# Patient Record
Sex: Female | Born: 1970
Health system: Southern US, Community
[De-identification: ages and names within clinical notes are randomized; demographics above are authoritative.]

## PROBLEM LIST (undated history)

## (undated) DIAGNOSIS — Z789 Other specified health status: Secondary | ICD-10-CM

---

## 2015-04-27 ENCOUNTER — Other Ambulatory Visit (HOSPITAL_COMMUNITY): Payer: Self-pay | Admitting: Internal Medicine

## 2015-04-27 DIAGNOSIS — Z1231 Encounter for screening mammogram for malignant neoplasm of breast: Secondary | ICD-10-CM

## 2015-05-11 ENCOUNTER — Ambulatory Visit (HOSPITAL_COMMUNITY)
Admission: RE | Admit: 2015-05-11 | Discharge: 2015-05-11 | Disposition: A | Discharge status: Home / Self Care | Payer: BLUE CROSS/BLUE SHIELD | Source: Ambulatory Visit | Attending: Internal Medicine | Admitting: Internal Medicine

## 2015-05-11 DIAGNOSIS — Z1231 Encounter for screening mammogram for malignant neoplasm of breast: Secondary | ICD-10-CM | POA: Diagnosis present

## 2015-11-17 DIAGNOSIS — Z0000 Encounter for general adult medical examination without abnormal findings: Secondary | ICD-10-CM | POA: Diagnosis not present

## 2015-11-21 DIAGNOSIS — Z0000 Encounter for general adult medical examination without abnormal findings: Secondary | ICD-10-CM | POA: Diagnosis not present

## 2016-04-08 ENCOUNTER — Other Ambulatory Visit (HOSPITAL_COMMUNITY): Payer: Self-pay | Admitting: Internal Medicine

## 2016-04-08 DIAGNOSIS — Z1231 Encounter for screening mammogram for malignant neoplasm of breast: Secondary | ICD-10-CM

## 2016-05-13 ENCOUNTER — Ambulatory Visit (HOSPITAL_COMMUNITY): Payer: BLUE CROSS/BLUE SHIELD

## 2016-05-15 ENCOUNTER — Ambulatory Visit (HOSPITAL_COMMUNITY)
Admission: RE | Admit: 2016-05-15 | Discharge: 2016-05-15 | Disposition: A | Discharge status: Home / Self Care | Payer: BLUE CROSS/BLUE SHIELD | Source: Ambulatory Visit | Attending: Internal Medicine | Admitting: Internal Medicine

## 2016-05-15 DIAGNOSIS — Z1231 Encounter for screening mammogram for malignant neoplasm of breast: Secondary | ICD-10-CM | POA: Diagnosis not present

## 2016-05-16 ENCOUNTER — Other Ambulatory Visit: Payer: Self-pay | Admitting: Internal Medicine

## 2016-05-16 DIAGNOSIS — R928 Other abnormal and inconclusive findings on diagnostic imaging of breast: Secondary | ICD-10-CM

## 2016-05-27 DIAGNOSIS — Z23 Encounter for immunization: Secondary | ICD-10-CM | POA: Diagnosis not present

## 2016-06-04 ENCOUNTER — Ambulatory Visit (HOSPITAL_COMMUNITY)
Admission: RE | Admit: 2016-06-04 | Discharge: 2016-06-04 | Disposition: A | Discharge status: Home / Self Care | Payer: BLUE CROSS/BLUE SHIELD | Source: Ambulatory Visit | Attending: Internal Medicine | Admitting: Internal Medicine

## 2016-06-04 DIAGNOSIS — R928 Other abnormal and inconclusive findings on diagnostic imaging of breast: Secondary | ICD-10-CM | POA: Diagnosis not present

## 2016-07-31 DIAGNOSIS — Z01419 Encounter for gynecological examination (general) (routine) without abnormal findings: Secondary | ICD-10-CM | POA: Diagnosis not present

## 2016-07-31 DIAGNOSIS — Z6832 Body mass index (BMI) 32.0-32.9, adult: Secondary | ICD-10-CM | POA: Diagnosis not present

## 2017-04-24 ENCOUNTER — Other Ambulatory Visit (HOSPITAL_COMMUNITY): Payer: Self-pay | Admitting: Internal Medicine

## 2017-04-24 DIAGNOSIS — Z1231 Encounter for screening mammogram for malignant neoplasm of breast: Secondary | ICD-10-CM

## 2017-05-26 ENCOUNTER — Ambulatory Visit (HOSPITAL_COMMUNITY)
Admission: RE | Admit: 2017-05-26 | Discharge: 2017-05-26 | Disposition: A | Discharge status: Home / Self Care | Payer: BLUE CROSS/BLUE SHIELD | Source: Ambulatory Visit | Attending: Internal Medicine | Admitting: Internal Medicine

## 2017-05-26 DIAGNOSIS — Z1231 Encounter for screening mammogram for malignant neoplasm of breast: Secondary | ICD-10-CM | POA: Insufficient documentation

## 2017-06-10 DIAGNOSIS — Z6831 Body mass index (BMI) 31.0-31.9, adult: Secondary | ICD-10-CM | POA: Diagnosis not present

## 2017-06-10 DIAGNOSIS — Z23 Encounter for immunization: Secondary | ICD-10-CM | POA: Diagnosis not present

## 2017-06-10 DIAGNOSIS — B079 Viral wart, unspecified: Secondary | ICD-10-CM | POA: Diagnosis not present

## 2017-06-16 DIAGNOSIS — L280 Lichen simplex chronicus: Secondary | ICD-10-CM | POA: Diagnosis not present

## 2017-06-16 DIAGNOSIS — Z1283 Encounter for screening for malignant neoplasm of skin: Secondary | ICD-10-CM | POA: Diagnosis not present

## 2017-06-16 DIAGNOSIS — L918 Other hypertrophic disorders of the skin: Secondary | ICD-10-CM | POA: Diagnosis not present

## 2017-07-09 DIAGNOSIS — L918 Other hypertrophic disorders of the skin: Secondary | ICD-10-CM | POA: Diagnosis not present

## 2017-09-03 DIAGNOSIS — Z6833 Body mass index (BMI) 33.0-33.9, adult: Secondary | ICD-10-CM | POA: Diagnosis not present

## 2017-09-03 DIAGNOSIS — Z01419 Encounter for gynecological examination (general) (routine) without abnormal findings: Secondary | ICD-10-CM | POA: Diagnosis not present

## 2017-12-02 DIAGNOSIS — Z6832 Body mass index (BMI) 32.0-32.9, adult: Secondary | ICD-10-CM | POA: Diagnosis not present

## 2017-12-02 DIAGNOSIS — B079 Viral wart, unspecified: Secondary | ICD-10-CM | POA: Diagnosis not present

## 2017-12-02 DIAGNOSIS — Z0000 Encounter for general adult medical examination without abnormal findings: Secondary | ICD-10-CM | POA: Diagnosis not present

## 2018-04-22 ENCOUNTER — Other Ambulatory Visit (HOSPITAL_COMMUNITY): Payer: Self-pay | Admitting: Physician Assistant

## 2018-04-22 DIAGNOSIS — Z1231 Encounter for screening mammogram for malignant neoplasm of breast: Secondary | ICD-10-CM

## 2018-05-28 ENCOUNTER — Encounter (HOSPITAL_COMMUNITY): Payer: Self-pay

## 2018-05-28 ENCOUNTER — Ambulatory Visit (HOSPITAL_COMMUNITY)
Admission: RE | Admit: 2018-05-28 | Discharge: 2018-05-28 | Disposition: A | Discharge status: Home / Self Care | Payer: BLUE CROSS/BLUE SHIELD | Source: Ambulatory Visit | Attending: Physician Assistant | Admitting: Physician Assistant

## 2018-05-28 DIAGNOSIS — Z1231 Encounter for screening mammogram for malignant neoplasm of breast: Secondary | ICD-10-CM | POA: Diagnosis not present

## 2018-06-01 ENCOUNTER — Other Ambulatory Visit (HOSPITAL_COMMUNITY): Payer: Self-pay | Admitting: Physician Assistant

## 2018-06-01 DIAGNOSIS — R928 Other abnormal and inconclusive findings on diagnostic imaging of breast: Secondary | ICD-10-CM

## 2018-06-23 ENCOUNTER — Ambulatory Visit (HOSPITAL_COMMUNITY)
Admission: RE | Admit: 2018-06-23 | Discharge: 2018-06-23 | Disposition: A | Discharge status: Home / Self Care | Payer: BLUE CROSS/BLUE SHIELD | Source: Ambulatory Visit | Attending: Physician Assistant | Admitting: Physician Assistant

## 2018-06-23 ENCOUNTER — Ambulatory Visit (HOSPITAL_COMMUNITY): Payer: BLUE CROSS/BLUE SHIELD

## 2018-06-23 DIAGNOSIS — R928 Other abnormal and inconclusive findings on diagnostic imaging of breast: Secondary | ICD-10-CM | POA: Insufficient documentation

## 2018-08-10 DIAGNOSIS — Z23 Encounter for immunization: Secondary | ICD-10-CM | POA: Diagnosis not present

## 2018-09-08 DIAGNOSIS — Z803 Family history of malignant neoplasm of breast: Secondary | ICD-10-CM | POA: Diagnosis not present

## 2018-09-08 DIAGNOSIS — Z6834 Body mass index (BMI) 34.0-34.9, adult: Secondary | ICD-10-CM | POA: Diagnosis not present

## 2018-09-08 DIAGNOSIS — Z01419 Encounter for gynecological examination (general) (routine) without abnormal findings: Secondary | ICD-10-CM | POA: Diagnosis not present

## 2018-12-15 DIAGNOSIS — Z0000 Encounter for general adult medical examination without abnormal findings: Secondary | ICD-10-CM | POA: Diagnosis not present

## 2018-12-15 DIAGNOSIS — Z6834 Body mass index (BMI) 34.0-34.9, adult: Secondary | ICD-10-CM | POA: Diagnosis not present

## 2018-12-15 DIAGNOSIS — E782 Mixed hyperlipidemia: Secondary | ICD-10-CM | POA: Diagnosis not present

## 2018-12-15 DIAGNOSIS — B079 Viral wart, unspecified: Secondary | ICD-10-CM | POA: Diagnosis not present

## 2019-05-06 DIAGNOSIS — B079 Viral wart, unspecified: Secondary | ICD-10-CM | POA: Diagnosis not present

## 2019-05-06 DIAGNOSIS — Z23 Encounter for immunization: Secondary | ICD-10-CM | POA: Diagnosis not present

## 2019-05-12 ENCOUNTER — Other Ambulatory Visit (HOSPITAL_COMMUNITY): Payer: Self-pay | Admitting: Physician Assistant

## 2019-05-12 DIAGNOSIS — Z1231 Encounter for screening mammogram for malignant neoplasm of breast: Secondary | ICD-10-CM

## 2019-05-31 DIAGNOSIS — B079 Viral wart, unspecified: Secondary | ICD-10-CM | POA: Diagnosis not present

## 2019-06-10 ENCOUNTER — Ambulatory Visit (HOSPITAL_COMMUNITY)
Admission: RE | Admit: 2019-06-10 | Discharge: 2019-06-10 | Disposition: A | Discharge status: Home / Self Care | Payer: BC Managed Care – PPO | Source: Ambulatory Visit | Attending: Physician Assistant | Admitting: Physician Assistant

## 2019-06-10 ENCOUNTER — Encounter (HOSPITAL_COMMUNITY): Payer: Self-pay

## 2019-06-10 ENCOUNTER — Other Ambulatory Visit: Payer: Self-pay

## 2019-06-10 DIAGNOSIS — Z1231 Encounter for screening mammogram for malignant neoplasm of breast: Secondary | ICD-10-CM | POA: Diagnosis not present

## 2019-06-14 DIAGNOSIS — B079 Viral wart, unspecified: Secondary | ICD-10-CM | POA: Diagnosis not present

## 2019-06-14 DIAGNOSIS — Z1389 Encounter for screening for other disorder: Secondary | ICD-10-CM | POA: Diagnosis not present

## 2019-06-14 DIAGNOSIS — Z1331 Encounter for screening for depression: Secondary | ICD-10-CM | POA: Diagnosis not present

## 2019-11-25 ENCOUNTER — Ambulatory Visit: Payer: Self-pay | Attending: Internal Medicine

## 2019-11-25 DIAGNOSIS — Z23 Encounter for immunization: Secondary | ICD-10-CM

## 2019-11-25 NOTE — Progress Notes (Signed)
   Covid-19 Vaccination Clinic  Name:  Cheryl Steele    MRN: [id number] DOB: 23-Dec-1970  11/25/2019  [name] was observed post Covid-19 immunization for 15 minutes without incident. She was provided with Vaccine Information Sheet and instruction to access the V-Safe system.   [name] was instructed to call 911 with any severe reactions post vaccine: Marland Kitchen Difficulty breathing  . Swelling of face and throat  . A fast heartbeat  . A bad rash all over body  . Dizziness and weakness   Immunizations Administered    Name Date Dose VIS Date Route   Moderna COVID-19 Vaccine 11/25/2019  9:30 AM 0.5 mL 06/2019 Intramuscular   Manufacturer: Moderna   Lot: [id number]   NDC: 43601-658-00

## 2019-12-23 ENCOUNTER — Ambulatory Visit: Payer: Self-pay | Attending: Internal Medicine

## 2019-12-23 DIAGNOSIS — Z23 Encounter for immunization: Secondary | ICD-10-CM

## 2019-12-23 NOTE — Progress Notes (Signed)
   Covid-19 Vaccination Clinic  Name:  Ninfa Giannelli    MRN: [id number] DOB: [birth year]  12/23/2019  Ms. Donson was observed post Covid-19 immunization for 15 minutes without incident. She was provided with Vaccine Information Sheet and instruction to access the V-Safe system.   Ms. Pair was instructed to call 911 with any severe reactions post vaccine: Marland Kitchen Difficulty breathing  . Swelling of face and throat  . A fast heartbeat  . A bad rash all over body  . Dizziness and weakness   Immunizations Administered    Name Date Dose VIS Date Route   Moderna COVID-19 Vaccine 12/23/2019 10:32 AM 0.5 mL 06/2019 Intramuscular   Manufacturer: Moderna   Lot: [id number]   NDC: 03014-996-92

## 2020-04-05 ENCOUNTER — Other Ambulatory Visit (HOSPITAL_BASED_OUTPATIENT_CLINIC_OR_DEPARTMENT_OTHER): Payer: Self-pay

## 2020-04-05 DIAGNOSIS — R0683 Snoring: Secondary | ICD-10-CM

## 2020-04-21 ENCOUNTER — Other Ambulatory Visit: Payer: Self-pay

## 2020-04-21 ENCOUNTER — Ambulatory Visit: Payer: No Typology Code available for payment source | Attending: Physician Assistant | Admitting: Neurology

## 2020-04-21 DIAGNOSIS — G4733 Obstructive sleep apnea (adult) (pediatric): Secondary | ICD-10-CM | POA: Insufficient documentation

## 2020-04-21 DIAGNOSIS — R0683 Snoring: Secondary | ICD-10-CM | POA: Insufficient documentation

## 2020-04-30 NOTE — Procedures (Addendum)
   [facility] Keamber Macfadden A. Gerilyn Pilgrim, MD     [email or web address]             NOCTURNAL POLYSOMNOGRAPHY   LOCATION: [facility]  Patient Name: Cheryl Steele, Cheryl Steele Date: 04/21/2020 Gender: Female D.O.B: 1971/05/27 Age (years): 30 Referring Provider: Arlyss Repress Allwardt Height (inches): 62 Interpreting Physician: Beryle Beams MD, ABSM Weight (lbs): 196 RPSGT: Peak, Robert BMI: 36 MRN: [id number] Neck Size: 13.50 CLINICAL INFORMATION Sleep Study Type: NPSG     Indication for sleep study: N/A     Epworth Sleepiness Score: 10     SLEEP STUDY TECHNIQUE As per the AASM Manual for the Scoring of Sleep and Associated Events v2.3 (April 2016) with a hypopnea requiring 4% desaturations.  The channels recorded and monitored were frontal, central and occipital EEG, electrooculogram (EOG), submentalis EMG (chin), nasal and oral airflow, thoracic and abdominal wall motion, anterior tibialis EMG, snore microphone, electrocardiogram, and pulse oximetry.  MEDICATIONS Medications self-administered by patient taken the night of the study : N/A No current outpatient medications on file.      SLEEP ARCHITECTURE The study was initiated at 9:20:10 PM and ended at 4:30:55 AM.  Sleep onset time was 49.9 minutes and the sleep efficiency was 81.9%%. The total sleep time was 352.8 minutes.  Stage REM latency was 124.0 minutes.  The patient spent 4.3%% of the night in stage N1 sleep, 80.6%% in stage N2 sleep, 3.4%% in stage N3 and 11.8% in REM.  Alpha intrusion was absent.  Supine sleep was 11.71%.  RESPIRATORY PARAMETERS The overall apnea/hypopnea index (AHI) was 15.5 per hour. There were 2 total apneas, including 2 obstructive, 0 central and 0 mixed apneas. There were 89 hypopneas and 0 RERAs.  The AHI during Stage REM sleep was 44.8 per hour.  AHI while supine was 43.6 per hour.  The mean oxygen saturation was 93.9%. The minimum SpO2 during sleep was 83.0%.  loud  snoring was noted during this study.  CARDIAC DATA The 2 lead EKG demonstrated sinus rhythm. The mean heart rate was 80.7 beats per minute. Other EKG findings include: None.  LEG MOVEMENT DATA The total PLMS were 0 with a resulting PLMS index of 0.0. Associated arousal with leg movement index was 0.0.  IMPRESSIONS Moderate obstructive sleep apnea worse during REM sleep is noted. AutoPAP 8-14 is recommended.    Argie Ramming, MD Diplomate, American Board of Sleep Medicine.  ELECTRONICALLY SIGNED ON:  04/30/2020, 9:49 PM [facility] PH: [phone]   FX: [phone] ACCREDITED BY THE AMERICAN ACADEMY OF SLEEP MEDICINE

## 2020-05-04 ENCOUNTER — Other Ambulatory Visit (HOSPITAL_COMMUNITY): Payer: Self-pay | Admitting: Physician Assistant

## 2020-05-04 DIAGNOSIS — Z1231 Encounter for screening mammogram for malignant neoplasm of breast: Secondary | ICD-10-CM

## 2020-06-12 ENCOUNTER — Ambulatory Visit (HOSPITAL_COMMUNITY)
Admission: RE | Admit: 2020-06-12 | Discharge: 2020-06-12 | Disposition: A | Discharge status: Home / Self Care | Payer: No Typology Code available for payment source | Source: Ambulatory Visit | Attending: Physician Assistant | Admitting: Physician Assistant

## 2020-06-12 ENCOUNTER — Other Ambulatory Visit: Payer: Self-pay

## 2020-06-12 DIAGNOSIS — Z1231 Encounter for screening mammogram for malignant neoplasm of breast: Secondary | ICD-10-CM | POA: Diagnosis present

## 2021-03-28 ENCOUNTER — Other Ambulatory Visit: Payer: Self-pay | Admitting: Family Medicine

## 2021-03-28 DIAGNOSIS — Z1211 Encounter for screening for malignant neoplasm of colon: Secondary | ICD-10-CM

## 2021-04-06 ENCOUNTER — Encounter: Payer: Self-pay | Admitting: Obstetrics & Gynecology

## 2021-04-06 ENCOUNTER — Ambulatory Visit (INDEPENDENT_AMBULATORY_CARE_PROVIDER_SITE_OTHER): Payer: No Typology Code available for payment source | Admitting: Obstetrics & Gynecology

## 2021-04-06 ENCOUNTER — Other Ambulatory Visit (HOSPITAL_COMMUNITY)
Admission: RE | Admit: 2021-04-06 | Discharge: 2021-04-06 | Disposition: A | Discharge status: Home / Self Care | Payer: No Typology Code available for payment source | Source: Ambulatory Visit | Attending: Obstetrics & Gynecology | Admitting: Obstetrics & Gynecology

## 2021-04-06 ENCOUNTER — Other Ambulatory Visit: Payer: Self-pay

## 2021-04-06 DIAGNOSIS — Z124 Encounter for screening for malignant neoplasm of cervix: Secondary | ICD-10-CM | POA: Insufficient documentation

## 2021-04-06 DIAGNOSIS — Z01419 Encounter for gynecological examination (general) (routine) without abnormal findings: Secondary | ICD-10-CM

## 2021-04-06 NOTE — Progress Notes (Signed)
   WELL-WOMAN EXAMINATION Patient name: Cheryl Steele MRN [id number]  Date of birth: May 02, 1971 Chief Complaint:   Annual Exam (Pap only)  History of Present Illness:   Cheryl Steele is a 50 y.o. G1P0 perimenopausal female being seen today for a routine well-woman exam.  Today she notes: no acute complaints or concerns   No LMP recorded. Patient is perimenopausal. Denies issues with her menses- last period April 2022- have started to become irregular- will have a few in a row then nothing for several months.  Denies issues with HMB or dysmenorrhea.   Last pap due today.  Last mammogram: 2021. Last colonoscopy: in the process of scheduling  Depression screen [facility] 2/9 04/06/2021  Decreased Interest 0  Down, Depressed, Hopeless 0  PHQ - 2 Score 0  Altered sleeping 0  Tired, decreased energy 0  Change in appetite 0  Feeling bad or failure about yourself  0  Trouble concentrating 0  Moving slowly or fidgety/restless 0  Suicidal thoughts 0  PHQ-9 Score 0      Review of Systems:   Pertinent items are noted in HPI Denies any headaches, blurred vision, fatigue, shortness of breath, chest pain, abdominal pain, bowel movements, urination, or intercourse unless otherwise stated above.  Pertinent History Reviewed:  Reviewed past medical,surgical, social and family history.  Reviewed problem list, medications and allergies. Physical Assessment:   Vitals:   04/06/21 1034  BP: 133/77  Pulse: 77  Weight: 196 lb 9.6 oz (89.2 kg)  Height: 5\' 1"  (1.549 m)  Body mass index is 37.15 kg/m.        Physical Examination:   General appearance - well appearing, and in no distress  Mental status - alert, oriented to person, place, and time  Psych:  She has a normal mood and affect  Skin - warm and dry, normal color, no suspicious lesions noted  Chest - effort normal, all lung fields clear to auscultation bilaterally  Heart - normal rate and regular rhythm  Neck:  midline trachea, no  thyromegaly or nodules  Breasts - breasts appear normal, no suspicious masses, no skin or nipple changes or  axillary nodes  Abdomen - soft, nontender, nondistended, no masses or organomegaly  Pelvic - VULVA: normal appearing vulva with no masses, tenderness or lesions  VAGINA: normal appearing vagina with normal color and discharge, no lesions  CERVIX: normal appearing cervix without discharge or lesions, no CMT  Thin prep pap is done with HR HPV cotesting  UTERUS: uterus is felt to be normal size, shape, consistency and nontender   ADNEXA: No adnexal masses or tenderness noted.    Extremities:  No swelling or varicosities noted  Chaperone: Latisha Cresenzo     Assessment & Plan:  1) Cervical cancer screening -pap collected, reviewed screening guidelines -other screening exams up to date   No orders of the defined types were placed in this encounter.   Meds: No orders of the defined types were placed in this encounter.   Follow-up: Return in about 1 year (around 04/06/2022) for Annual.   04/08/2022, DO Attending Obstetrician & Gynecologist, [facility] for [facility], [facility]

## 2021-04-11 LAB — CYTOLOGY - PAP
Adequacy: ABSENT
Comment: NEGATIVE
Diagnosis: NEGATIVE
High risk HPV: NEGATIVE

## 2021-04-19 ENCOUNTER — Encounter: Payer: Self-pay | Admitting: General Surgery

## 2021-04-19 ENCOUNTER — Other Ambulatory Visit: Payer: Self-pay

## 2021-04-19 ENCOUNTER — Ambulatory Visit (INDEPENDENT_AMBULATORY_CARE_PROVIDER_SITE_OTHER): Payer: No Typology Code available for payment source | Admitting: General Surgery

## 2021-04-19 VITALS — BP 137/77 | HR 70 | Temp 98.3°F | Resp 14 | Ht 61.0 in | Wt 196.0 lb

## 2021-04-19 DIAGNOSIS — Z1211 Encounter for screening for malignant neoplasm of colon: Secondary | ICD-10-CM | POA: Diagnosis not present

## 2021-04-19 MED ORDER — SUTAB 1479-225-188 MG PO TABS: 1.0000 | ORAL_TABLET | Freq: Once | ORAL | 0 refills | Status: AC

## 2021-04-19 NOTE — Progress Notes (Signed)
Cheryl Steele; [id number]; 06-24-1971   HPI Patient is a 50 year old white female who was referred to my care by Dr. Fanny Bien for a screening colonoscopy.  Patient has never had a colonoscopy.  She denies any family history of colon cancer.  She denies any significant abdominal pain, weight change, diarrhea, constipation, or blood in her stools. History reviewed. No pertinent past medical history.  Past Surgical History:  Procedure Laterality Date   CESAREAN SECTION      Family History  Problem Relation Age of Onset   Breast cancer Sister    Breast cancer Maternal Aunt     Current Outpatient Medications on File Prior to Visit  Medication Sig Dispense Refill   Biotin 15176 MCG TABS Take by mouth.     Multiple Vitamin (MULTIVITAMIN) capsule Take 1 capsule by mouth daily.     Omega-3 Fatty Acids (FISH OIL PO) Take by mouth.     No current facility-administered medications on file prior to visit.    No Known Allergies  Social History   Substance and Sexual Activity  Alcohol Use Not Currently    Social History   Tobacco Use  Smoking Status Never  Smokeless Tobacco Never    Review of Systems  Constitutional:  Positive for malaise/fatigue.  HENT: Negative.    Eyes: Negative.   Respiratory: Negative.    Cardiovascular: Negative.   Gastrointestinal: Negative.   Genitourinary: Negative.   Musculoskeletal: Negative.   Skin: Negative.   Neurological: Negative.   Endo/Heme/Allergies: Negative.   Psychiatric/Behavioral: Negative.     Objective   Vitals:   04/19/21 1004  BP: 137/77  Pulse: 70  Resp: 14  Temp: 98.3 F (36.8 C)  SpO2: 99%    Physical Exam Vitals reviewed.  Constitutional:      Appearance: Normal appearance. She is not ill-appearing.  HENT:     Head: Normocephalic and atraumatic.  Cardiovascular:     Rate and Rhythm: Normal rate and regular rhythm.     Heart sounds: Normal heart sounds. No murmur heard.   No friction rub. No gallop.   Pulmonary:     Effort: Pulmonary effort is normal. No respiratory distress.     Breath sounds: Normal breath sounds. No stridor. No wheezing, rhonchi or rales.  Abdominal:     General: There is no distension.     Palpations: Abdomen is soft. There is no mass.     Tenderness: There is no abdominal tenderness. There is no guarding or rebound.     Hernia: No hernia is present.  Skin:    General: Skin is warm and dry.  Neurological:     Mental Status: She is alert and oriented to person, place, and time.  Primary care notes reviewed  Assessment  Need for screening colonoscopy Plan  Patient will call to schedule her screening colonoscopy.  The risks and benefits of the procedure including bleeding and perforation were fully explained to the patient, who gave informed consent.  Sutabs have been prescribed for bowel preparation.

## 2021-05-07 ENCOUNTER — Other Ambulatory Visit (HOSPITAL_COMMUNITY): Payer: Self-pay | Admitting: Family Medicine

## 2021-05-07 DIAGNOSIS — Z1231 Encounter for screening mammogram for malignant neoplasm of breast: Secondary | ICD-10-CM

## 2021-05-14 NOTE — H&P (Signed)
Cheryl Steele; [id number]; 03/31/1971   HPI Patient is a 49-year-old white female who was referred to my care by Dr. Aaron Thompson for a screening colonoscopy.  Patient has never had a colonoscopy.  She denies any family history of colon cancer.  She denies any significant abdominal pain, weight change, diarrhea, constipation, or blood in her stools. History reviewed. No pertinent past medical history.  Past Surgical History:  Procedure Laterality Date   CESAREAN SECTION      Family History  Problem Relation Age of Onset   Breast cancer Sister    Breast cancer Maternal Aunt     Current Outpatient Medications on File Prior to Visit  Medication Sig Dispense Refill   Biotin 10000 MCG TABS Take by mouth.     Multiple Vitamin (MULTIVITAMIN) capsule Take 1 capsule by mouth daily.     Omega-3 Fatty Acids (FISH OIL PO) Take by mouth.     No current facility-administered medications on file prior to visit.    No Known Allergies  Social History   Substance and Sexual Activity  Alcohol Use Not Currently    Social History   Tobacco Use  Smoking Status Never  Smokeless Tobacco Never    Review of Systems  Constitutional:  Positive for malaise/fatigue.  HENT: Negative.    Eyes: Negative.   Respiratory: Negative.    Cardiovascular: Negative.   Gastrointestinal: Negative.   Genitourinary: Negative.   Musculoskeletal: Negative.   Skin: Negative.   Neurological: Negative.   Endo/Heme/Allergies: Negative.   Psychiatric/Behavioral: Negative.     Objective   Vitals:   04/19/21 1004  BP: 137/77  Pulse: 70  Resp: 14  Temp: 98.3 F (36.8 C)  SpO2: 99%    Physical Exam Vitals reviewed.  Constitutional:      Appearance: Normal appearance. She is not ill-appearing.  HENT:     Head: Normocephalic and atraumatic.  Cardiovascular:     Rate and Rhythm: Normal rate and regular rhythm.     Heart sounds: Normal heart sounds. No murmur heard.   No friction rub. No gallop.   Pulmonary:     Effort: Pulmonary effort is normal. No respiratory distress.     Breath sounds: Normal breath sounds. No stridor. No wheezing, rhonchi or rales.  Abdominal:     General: There is no distension.     Palpations: Abdomen is soft. There is no mass.     Tenderness: There is no abdominal tenderness. There is no guarding or rebound.     Hernia: No hernia is present.  Skin:    General: Skin is warm and dry.  Neurological:     Mental Status: She is alert and oriented to person, place, and time.  Primary care notes reviewed  Assessment  Need for screening colonoscopy Plan  Patient will call to schedule her screening colonoscopy.  The risks and benefits of the procedure including bleeding and perforation were fully explained to the patient, who gave informed consent.  Sutabs have been prescribed for bowel preparation. 

## 2021-05-15 ENCOUNTER — Encounter (HOSPITAL_COMMUNITY)
Admission: RE | Disposition: A | Discharge status: Home / Self Care | Payer: Self-pay | Source: Home / Self Care | Attending: General Surgery

## 2021-05-15 ENCOUNTER — Other Ambulatory Visit: Payer: Self-pay

## 2021-05-15 ENCOUNTER — Encounter (HOSPITAL_COMMUNITY): Payer: Self-pay | Admitting: General Surgery

## 2021-05-15 ENCOUNTER — Ambulatory Visit (HOSPITAL_COMMUNITY)
Admission: RE | Admit: 2021-05-15 | Discharge: 2021-05-15 | Disposition: A | Discharge status: Home / Self Care | Payer: PRIVATE HEALTH INSURANCE | Attending: General Surgery | Admitting: General Surgery

## 2021-05-15 DIAGNOSIS — Z1211 Encounter for screening for malignant neoplasm of colon: Secondary | ICD-10-CM | POA: Insufficient documentation

## 2021-05-15 DIAGNOSIS — K644 Residual hemorrhoidal skin tags: Secondary | ICD-10-CM | POA: Diagnosis not present

## 2021-05-15 HISTORY — DX: Other specified health status: Z78.9

## 2021-05-15 HISTORY — PX: COLONOSCOPY: SHX5424

## 2021-05-15 SURGERY — COLONOSCOPY
Anesthesia: Moderate Sedation

## 2021-05-15 MED ORDER — MIDAZOLAM HCL 5 MG/5ML IJ SOLN
INTRAMUSCULAR | Status: DC | PRN
  Administered 2021-05-15: 1 mg via INTRAVENOUS
  Administered 2021-05-15: 2 mg via INTRAVENOUS

## 2021-05-15 MED ORDER — MIDAZOLAM HCL 5 MG/5ML IJ SOLN
INTRAMUSCULAR | Status: AC
  Filled 2021-05-15: qty 10

## 2021-05-15 MED ORDER — SODIUM CHLORIDE 0.9 % IV SOLN: INTRAVENOUS | Status: DC

## 2021-05-15 MED ORDER — MEPERIDINE HCL 50 MG/ML IJ SOLN
INTRAMUSCULAR | Status: DC | PRN
  Administered 2021-05-15: 50 mg via INTRAVENOUS

## 2021-05-15 MED ORDER — MEPERIDINE HCL 50 MG/ML IJ SOLN
INTRAMUSCULAR | Status: AC
  Filled 2021-05-15: qty 1

## 2021-05-15 NOTE — Interval H&P Note (Signed)
History and Physical Interval Note:  05/15/2021 7:21 AM  Cheryl Steele  has presented today for surgery, with the diagnosis of Screening.  The various methods of treatment have been discussed with the patient and family. After consideration of risks, benefits and other options for treatment, the patient has consented to  Procedure(s): COLONOSCOPY (N/A) as a surgical intervention.  The patient's history has been reviewed, patient examined, no change in status, stable for surgery.  I have reviewed the patient's chart and labs.  Questions were answered to the patient's satisfaction.     Franky Macho

## 2021-05-15 NOTE — Op Note (Signed)
[facility] Patient Name: Cheryl Steele Procedure Date: 05/15/2021 7:07 AM MRN: [id number] Date of Birth: [birth year] Attending MD: Franky Macho , MD CSN: [id number] Age: 50 Admit Type: Outpatient Procedure:                Colonoscopy Indications:              Screening for colorectal malignant neoplasm Providers:                Franky Macho, MD, Angelica Ran, Kristine L. Jessee Avers, Technician Referring MD:              Medicines:                Midazolam 3 mg IV, Meperidine 50 mg IV Complications:            No immediate complications. Estimated Blood Loss:     Estimated blood loss: none. Procedure:                Pre-Anesthesia Assessment:                           - Prior to the procedure, a History and Physical                            was performed, and patient medications and                            allergies were reviewed. The patient is competent.                            The risks and benefits of the procedure and the                            sedation options and risks were discussed with the                            patient. All questions were answered and informed                            consent was obtained. Patient identification and                            proposed procedure were verified by the physician,                            the nurse and the technician in the endoscopy                            suite. Mental Status Examination: alert and                            oriented. Airway Examination: normal oropharyngeal  airway and neck mobility. Respiratory Examination:                            clear to auscultation. CV Examination: RRR, no                            murmurs, no S3 or S4. Prophylactic Antibiotics: The                            patient does not require prophylactic antibiotics.                            Prior Anticoagulants: The patient has taken no                             previous anticoagulant or antiplatelet agents. ASA                            Grade Assessment: I - A normal, healthy patient.                            After reviewing the risks and benefits, the patient                            was deemed in satisfactory condition to undergo the                            procedure. The anesthesia plan was to use moderate                            sedation / analgesia (conscious sedation).                            Immediately prior to administration of medications,                            the patient was re-assessed for adequacy to receive                            sedatives. The heart rate, respiratory rate, oxygen                            saturations, blood pressure, adequacy of pulmonary                            ventilation, and response to care were monitored                            throughout the procedure. The physical status of                            the patient was re-assessed after the procedure.  After obtaining informed consent, the colonoscope                            was passed under direct vision. Throughout the                            procedure, the patient's blood pressure, pulse, and                            oxygen saturations were monitored continuously. The                            [id number]) scope was introduced through the                            anus and advanced to the the cecum, identified by                            the appendiceal orifice, ileocecal valve and                            palpation. No anatomical landmarks were                            photographed. The entire colon was well visualized.                            The colonoscopy was performed without difficulty.                            The patient tolerated the procedure well. The                            quality of the bowel preparation was adequate. The                            total  duration of the procedure was 13 minutes. Scope In: 7:31:48 AM Scope Out: 7:42:19 AM Scope Withdrawal Time: 0 hours 6 minutes 11 seconds  Total Procedure Duration: 0 hours 10 minutes 31 seconds  Findings:      Skin tags were found on perianal exam.      The entire examined colon appeared normal on direct and retroflexion       views. Impression:               - Perianal skin tags found on perianal exam.                           - The entire examined colon is normal on direct and                            retroflexion views.                           - No specimens collected. Moderate Sedation:  Moderate (conscious) sedation was administered by the endoscopy nurse       and supervised by the endoscopist. The following parameters were       monitored: oxygen saturation, heart rate, blood pressure, and response       to care. Recommendation:           - Patient has a contact number available for                            emergencies. The signs and symptoms of potential                            delayed complications were discussed with the                            patient. Return to normal activities tomorrow.                            Written discharge instructions were provided to the                            patient.                           - Written discharge instructions were provided to                            the patient.                           - The signs and symptoms of potential delayed                            complications were discussed with the patient.                           - Patient has a contact number available for                            emergencies.                           - Return to normal activities tomorrow.                           - Resume previous diet.                           - Continue present medications.                           - Repeat colonoscopy in 10 years for screening                             purposes. Procedure Code(s):        --- Professional ---  16010, Colonoscopy, flexible; diagnostic, including                            collection of specimen(s) by brushing or washing,                            when performed (separate procedure) Diagnosis Code(s):        --- Professional ---                           Z12.11, Encounter for screening for malignant                            neoplasm of colon                           K64.4, Residual hemorrhoidal skin tags CPT copyright 2019 American Medical Association. All rights reserved. The codes documented in this report are preliminary and upon coder review may  be revised to meet current compliance requirements. Franky Macho, MD Franky Macho, MD 05/15/2021 7:45:48 AM This report has been signed electronically. Number of Addenda: 0

## 2021-05-17 ENCOUNTER — Encounter (HOSPITAL_COMMUNITY): Payer: Self-pay | Admitting: General Surgery

## 2021-06-20 ENCOUNTER — Ambulatory Visit (HOSPITAL_COMMUNITY): Payer: No Typology Code available for payment source

## 2021-06-21 ENCOUNTER — Other Ambulatory Visit: Payer: Self-pay

## 2021-06-21 ENCOUNTER — Ambulatory Visit (HOSPITAL_COMMUNITY)
Admission: RE | Admit: 2021-06-21 | Discharge: 2021-06-21 | Disposition: A | Discharge status: Home / Self Care | Payer: PRIVATE HEALTH INSURANCE | Source: Ambulatory Visit | Attending: Family Medicine | Admitting: Family Medicine

## 2021-06-21 DIAGNOSIS — Z1231 Encounter for screening mammogram for malignant neoplasm of breast: Secondary | ICD-10-CM | POA: Diagnosis present

## 2022-05-08 ENCOUNTER — Other Ambulatory Visit (HOSPITAL_COMMUNITY): Payer: Self-pay | Admitting: Physician Assistant

## 2022-05-08 DIAGNOSIS — Z1231 Encounter for screening mammogram for malignant neoplasm of breast: Secondary | ICD-10-CM

## 2022-06-24 ENCOUNTER — Ambulatory Visit (HOSPITAL_COMMUNITY): Payer: PRIVATE HEALTH INSURANCE

## 2022-06-27 ENCOUNTER — Encounter (HOSPITAL_COMMUNITY): Payer: Self-pay | Admitting: Obstetrics and Gynecology

## 2022-06-27 ENCOUNTER — Ambulatory Visit (HOSPITAL_COMMUNITY)
Admission: RE | Admit: 2022-06-27 | Discharge: 2022-06-27 | Disposition: A | Discharge status: Home / Self Care | Payer: BC Managed Care – PPO | Source: Ambulatory Visit | Attending: Physician Assistant | Admitting: Physician Assistant

## 2022-06-27 DIAGNOSIS — Z1231 Encounter for screening mammogram for malignant neoplasm of breast: Secondary | ICD-10-CM | POA: Diagnosis present

## 2022-09-10 ENCOUNTER — Telehealth: Payer: Self-pay

## 2022-09-10 NOTE — Telephone Encounter (Signed)
Received sleep TOC referral from [facility]. Placed in sleep lab mailbox

## 2022-09-23 ENCOUNTER — Telehealth: Payer: Self-pay | Admitting: Obstetrics & Gynecology

## 2022-09-23 NOTE — Telephone Encounter (Signed)
Patient states she was to let you know that she has met her year of being menstrual free. And she also wants to know if she still needs to have an annual every year. Please advise.

## 2022-10-09 ENCOUNTER — Ambulatory Visit (INDEPENDENT_AMBULATORY_CARE_PROVIDER_SITE_OTHER): Payer: BC Managed Care – PPO | Admitting: Neurology

## 2022-10-09 ENCOUNTER — Encounter: Payer: Self-pay | Admitting: Neurology

## 2022-10-09 VITALS — BP 157/90 | HR 81 | Ht 61.0 in | Wt 205.0 lb

## 2022-10-09 DIAGNOSIS — G4733 Obstructive sleep apnea (adult) (pediatric): Secondary | ICD-10-CM | POA: Diagnosis not present

## 2022-10-09 NOTE — Progress Notes (Signed)
SLEEP MEDICINE CLINIC    Provider:  Larey Seat, MD  Primary Care Physician:  Cheryl Hollow, MD [place] [address]     Referring Provider: Barton Fanny, Np [address] [place],  [place] [address]          Chief Complaint according to patient   Patient presents with:     New Patient (Initial Visit)           HISTORY OF PRESENT ILLNESS:  Cheryl Steele is a 52 y.o. female patient who is seen upon referral on 10/09/2022 from PCP [place] Silos for Transfer of Care from Dr Cheryl Steele,  for a CPAP patient .  Chief concern according to patient :  " I was diagnosed in 04-2020, finally got CPAP 7/ 2022 with the post-pandemic supply chain delays. I went to holiday camp with my son and used it there for the first time- and managed it well".  Before CPAP my husband  was very bothered by my snoring. He had moved to another room. The test was done at [facility] lab. I was always easily falling asleep, on any 20 minute car ride. "I could always sleep easily, never struggled to sleep" .    I have the pleasure of seeing Cheryl Steele 10/09/22 a right-handed caucasian female with OSA sleep disorder.    Sleep relevant medical history: later in life pregnancy, son born on 06-18-2014. Uncomplicated pregnancy. C section- No Tonsillectomy, wisdom teeth extraction, all 4. Wore braces and retainers. No RLS, no gestational diabetes.  Family medical /sleep history: no other biological family member on CPAP with OSA.  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 One cat as pet.  Tobacco use: none.  ETOH use ; never. Caffeine intake in form of Coffee( /) Soda( 1 a week) Tea ( /) or energy drinks Exercise in form of  none-.   Hobbies : PTA, volunteer.       Sleep habits are as follows: The patient's dinner time is between 5-7 PM. The patient goes to bed at 10-11 PM and her son goes to sleep by  10 PM- continues to sleep for 6.5 hours.  The preferred sleep position is right side, with the support of 1 pillow. . Dreams are reportedly infrequent.   The patient wakes up with an alarm. 5  AM is the usual rise time. She reports not feeling refreshed or restored in AM, with symptoms such as dry mouth, but no morning headaches, and residual fatigue.  Naps are taken frequently,  usually between 8 -9 am lasting from 10 to 45 minutes and are more refreshing.   [name] has been a highly compliant user of CPAP 100% by the last 90 days and 99% by user time with 6 hours and 53 minutes on average.  The machine is an AutoSet AirSense 11 by [employer] with a serial [id number] and was issued in July 2022.  The minimum pressure is 8 with a maximum pressure of 14 cm water pressure with 1 cm water expiratory relief.  Her residual apnea is only 0.6/h no central apneas are arising.  The 95th percentile pressure is 12.3 cm water.  Her facemask must fit very well because her 95th percentile air leak is extremely low at 2.8 L a minute.  This speaks for good air seal.   The  patient's nocturnal polysomnography took place on April 21, 2020 at any [facility] the AHI or apnea hypopnea index was 15.5/h which is mild.  The apneas were obstructive only there were 89 hypopneas and 2 frank apneas.  The AHI rose significantly and REM sleep at 44.8/h.  This indicates hypoventilation and is usually related to body mass.  The mean oxygen saturation was 93% the minimum was 83% and the interpreter noted loud snoring.   Review of Systems: Out of a complete 14 system review, the patient complains of only the following symptoms, and all other reviewed systems are negative.:  Fatigue, sleepiness , snoring, before CPAP    How likely are you to doze in the following situations: 0 = not likely, 1 = slight chance, 2 = moderate chance, 3 = high chance   Sitting and Reading? Watching Television? Sitting inactive in a public  place (theater or meeting)? As a passenger in a car for an hour without a break? Lying down in the afternoon when circumstances permit? Sitting and talking to someone? Sitting quietly after lunch without alcohol? In a car, while stopped for a few minutes in traffic?   Total = 12/ 24 points   FSS endorsed at 10/ 63 points.   Social History   Socioeconomic History   Marital status: Married    Spouse name: Not on file   Number of children: Not on file   Years of education: Not on file   Highest education level: Not on file  Occupational History   Not on file  Tobacco Use   Smoking status: Never   Smokeless tobacco: Never  Vaping Use   Vaping Use: Never used  Substance and Sexual Activity   Alcohol use: Not Currently   Drug use: Never   Sexual activity: Yes    Birth control/protection: None  Other Topics Concern   Not on file  Social History Narrative   Not on file   Social Determinants of Health   Financial Resource Strain: Low Risk  (04/06/2021)   Overall Financial Resource Strain (CARDIA)    Difficulty of Paying Living Expenses: Not hard at all  Food Insecurity: No Food Insecurity (04/06/2021)   Hunger Vital Sign    Worried About Running Out of Food in the Last Year: Never true    [place] in the Last Year: Never true  Transportation Needs: No Transportation Needs (04/06/2021)   PRAPARE - Hydrologist (Medical): No    Lack of Transportation (Non-Medical): No  Physical Activity: Insufficiently Active (04/06/2021)   Exercise Vital Sign    Days of Exercise per Week: 2 days    Minutes of Exercise per Session: 10 min  Stress: No Stress Concern Present (04/06/2021)   [place]    Feeling of Stress : Not at all  Social Connections: [place] (04/06/2021)   Social Connection and Isolation Panel [NHANES]    Frequency of Communication with Friends and Family: More  than three times a week    Frequency of Social Gatherings with Friends and Family: Twice a week    Attends Religious Services: More than 4 times per year    Active Member of [employer] or Organizations: Yes    Attends Music therapist: More than 4 times per year    Marital Status: Married    Family History  Problem Relation Age of Onset   Breast cancer Sister  Breast cancer Maternal Aunt    Cancer - Colon Neg Hx     Past Medical History:  Diagnosis Date   Medical history non-contributory     Past Surgical History:  Procedure Laterality Date   CESAREAN SECTION     COLONOSCOPY N/A 05/15/2021   Procedure: COLONOSCOPY;  Surgeon: Aviva Signs, MD;  [facility];  Service: Gastroenterology;  Laterality: N/A;     Current Outpatient Medications on File Prior to Visit  Medication Sig Dispense Refill   Biotin 10000 MCG TABS Take 10,000 mcg by mouth in the morning.     Multiple Vitamin (MULTIVITAMIN WITH MINERALS) TABS tablet Take 1 tablet by mouth in the morning. WOMEN'S 50+     Omega-3 Fatty Acids (FISH OIL ULTRA) 1400 MG CAPS Take 1,400 mg by mouth in the morning.     rosuvastatin (CRESTOR) 5 MG tablet Take 5 mg by mouth daily.     ibuprofen (ADVIL) 200 MG tablet Take 400 mg by mouth daily as needed (headache/pain.). (Patient not taking: Reported on 10/09/2022)     No current facility-administered medications on file prior to visit.    No Known Allergies   DIAGNOSTIC DATA (LABS, IMAGING, TESTING) - I reviewed patient records, labs, notes, testing and imaging myself where available.  No results found for: "WBC", "HGB", "HCT", "MCV", "PLT" No results found for: "NA", "K", "CL", "CO2", "GLUCOSE", "BUN", "CREATININE", "CALCIUM", "PROT", "ALBUMIN", "AST", "ALT", "ALKPHOS", "BILITOT", "GFRNONAA", "GFRAA" No results found for: "CHOL", "HDL", "LDLCALC", "LDLDIRECT", "TRIG", "CHOLHDL" No results found for: "HGBA1C" No results found for: "VITAMINB12" No results  found for: "TSH"  PHYSICAL EXAM:  Today's Vitals   10/09/22 1057  BP: (!) 157/90  Pulse: 81  Weight: 205 lb (93 kg)  Height: 5\' 1"  (1.549 m)   Body mass index is 38.73 kg/m.   Wt Readings from Last 3 Encounters:  10/09/22 205 lb (93 kg)  05/15/21 197 lb (89.4 kg)  04/19/21 196 lb (88.9 kg)     Ht Readings from Last 3 Encounters:  10/09/22 5\' 1"  (1.549 m)  05/15/21 5\' 1"  (1.549 m)  04/19/21 5\' 1"  (1.549 m)      General: The patient is awake, alert and appears not in acute distress. The patient is well groomed. Head: Normocephalic, atraumatic. Neck is supple. Mallampati 3,  neck circumference:15 inches . Nasal airflow  patent.  Retrognathia is mild  seen.  Dental status:  Cardiovascular:  Regular rate and cardiac rhythm by pulse,  without distended neck veins. Respiratory: Lungs are clear to auscultation.  Skin:  Without evidence of ankle edema, or rash. Trunk: The patient's posture is erect.   NEUROLOGIC EXAM: The patient is awake and alert, oriented to place and time.   Memory subjective described as intact.  Attention span & concentration ability appears normal.  Speech is fluent,  without  dysarthria, dysphonia or aphasia.  Mood and affect are appropriate.   Cranial nerves: no loss of smell or taste reported  Pupils are equal and briskly reactive to light. Funduscopic exam deferred..  Extraocular movements in vertical and horizontal planes were intact and without nystagmus. No Diplopia. Visual fields by finger perimetry are intact. Hearing was intact to soft voice and finger rubbing.    Facial sensation intact to fine touch.  Facial motor strength is symmetric and tongue and uvula move midline.  Neck ROM : rotation, tilt and flexion extension were normal for age and shoulder shrug was symmetrical.    Motor exam:  Symmetric bulk,  tone and ROM.   Normal tone without cog wheeling, symmetric grip strength .   Sensory:  Fine touch and vibration were t normal.   Proprioception tested in the upper extremities was normal.   Coordination: Rapid alternating movements in the fingers/hands were of normal speed.  The Finger-to-nose maneuver was intact without evidence of ataxia, dysmetria or tremor.   Gait and station: Patient could rise unassisted from a seated position, walked without assistive device.  Stance is of normal width/ base .  Toe and heel walk were deferred.  Deep tendon reflexes: in the  upper and lower extremities are symmetric, brisk  .    ASSESSMENT AND PLAN 52 y.o. year old female  here with:  REM dependent obstructive hypoventilation - OSA.    1) Transfer of CPAP care,  at this time no need for changes in settings or  new mask fitting.    2) Risk for OSA ; BMI , abdominal girth, and high grade airway narrowing irregular dentition, and small lower jaw.   3) Continuity of care  with yearly visit.  No need for supplies at this time. [facility] is DME     I plan to follow up either personally or through our NP within 12 months.   I would like to thank Cheryl Hollow, MD and Cheryl Fanny, Np [address] [place],  [place] [address] for allowing me to meet with and to take care of this pleasant patient.    After spending a total time of  40  minutes face to face and additional time for physical and neurologic examination, review of laboratory studies,  personal review of imaging studies, reports and results of other testing and review of referral information / records as far as provided in visit,   Electronically signed by: Cheryl Seat, MD 10/09/2022 11:19 AM  [facility] Board certified by The [employer] of Sleep Medicine and Diplomate of the [employer] of Sleep Medicine. Board certified In Neurology through the [place], Fellow of the [employer] of Neurology. Medical Director of [facility].

## 2022-10-25 ENCOUNTER — Ambulatory Visit (INDEPENDENT_AMBULATORY_CARE_PROVIDER_SITE_OTHER): Payer: BC Managed Care – PPO | Admitting: Obstetrics & Gynecology

## 2022-10-25 ENCOUNTER — Encounter: Payer: Self-pay | Admitting: Obstetrics & Gynecology

## 2022-10-25 VITALS — BP 139/85 | HR 75 | Ht 61.0 in | Wt 207.5 lb

## 2022-10-25 DIAGNOSIS — N951 Menopausal and female climacteric states: Secondary | ICD-10-CM | POA: Diagnosis not present

## 2022-10-25 DIAGNOSIS — R739 Hyperglycemia, unspecified: Secondary | ICD-10-CM | POA: Diagnosis not present

## 2022-10-25 DIAGNOSIS — Z01419 Encounter for gynecological examination (general) (routine) without abnormal findings: Secondary | ICD-10-CM | POA: Diagnosis not present

## 2022-10-25 DIAGNOSIS — B3731 Acute candidiasis of vulva and vagina: Secondary | ICD-10-CM

## 2022-10-25 MED ORDER — MEDROXYPROGESTERONE ACETATE 10 MG PO TABS: 10.0000 mg | ORAL_TABLET | Freq: Every day | ORAL | 0 refills | Status: DC

## 2022-10-25 MED ORDER — TERCONAZOLE 0.4 % VA CREA: 1.0000 | TOPICAL_CREAM | Freq: Every day | VAGINAL | 0 refills | Status: DC

## 2022-10-25 NOTE — Progress Notes (Signed)
Subjective:     Cheryl Steele is a 52 y.o. female here for a routine exam.  No LMP recorded. Patient is perimenopausal. G1P0 Birth Control Method:  menopausal Menstrual Calendar(currently): amenorrhea since 2/23  Current complaints: none.   Current acute medical issues:  none   Recent Gynecologic History No LMP recorded. Patient is perimenopausal. Last Pap: 2/22,  normal Last mammogram: 12/23,  normal  Past Medical History:  Diagnosis Date   Medical history non-contributory     Past Surgical History:  Procedure Laterality Date   CESAREAN SECTION     COLONOSCOPY N/A 05/15/2021   Procedure: COLONOSCOPY;  Surgeon: Franky MachoJenkins, Mark, MD;  Location[facility];  Service: Gastroenterology;  Laterality: N/A;    OB History     Gravida  1   Para      Term      Preterm      AB      Living         SAB      IAB      Ectopic      Multiple      Live Births              Social History   Socioeconomic History   Marital status: Married    Spouse name: Not on file   Number of children: Not on file   Years of education: Not on file   Highest education level: Not on file  Occupational History   Not on file  Tobacco Use   Smoking status: Never   Smokeless tobacco: Never  Vaping Use   Vaping Use: Never used  Substance and Sexual Activity   Alcohol use: Not Currently   Drug use: Never   Sexual activity: Yes    Birth control/protection: None  Other Topics Concern   Not on file  Social History Narrative   Not on file   Social Determinants of Health   Financial Resource Strain: Low Risk  (04/06/2021)   Overall Financial Resource Strain (CARDIA)    Difficulty of Paying Living Expenses: Not hard at all  Food Insecurity: No Food Insecurity (04/06/2021)   Hunger Vital Sign    Worried About Running Out of Food in the Last Year: Never true    Ran Out of Food in the Last Year: Never true  Transportation Needs: No Transportation Needs (04/06/2021)   PRAPARE -  Administrator, Civil ServiceTransportation    Lack of Transportation (Medical): No    Lack of Transportation (Non-Medical): No  Physical Activity: Insufficiently Active (04/06/2021)   Exercise Vital Sign    Days of Exercise per Week: 2 days    Minutes of Exercise per Session: 10 min  Stress: No Stress Concern Present (04/06/2021)   [employer] - Occupational Stress Questionnaire    Feeling of Stress : Not at all  Social Connections: Socially Integrated (04/06/2021)   Social Connection and Isolation Panel [NHANES]    Frequency of Communication with Friends and Family: More than three times a week    Frequency of Social Gatherings with Friends and Family: Twice a week    Attends Religious Services: More than 4 times per year    Active Member of [employer] or Organizations: Yes    Attends Engineer, structuralClub or Organization Meetings: More than 4 times per year    Marital Status: Married    Family History  Problem Relation Age of Onset   Breast cancer Sister    Breast cancer Maternal Aunt  Cancer - Colon Neg Hx      Current Outpatient Medications:    Biotin 44695 MCG TABS, Take 10,000 mcg by mouth in the morning., Disp: , Rfl:    medroxyPROGESTERone (PROVERA) 10 MG tablet, Take 1 tablet (10 mg total) by mouth daily., Disp: 10 tablet, Rfl: 0   Multiple Vitamin (MULTIVITAMIN WITH MINERALS) TABS tablet, Take 1 tablet by mouth in the morning. WOMEN'S 50+, Disp: , Rfl:    Omega-3 Fatty Acids (FISH OIL ULTRA) 1400 MG CAPS, Take 1,400 mg by mouth in the morning., Disp: , Rfl:    rosuvastatin (CRESTOR) 5 MG tablet, Take 5 mg by mouth daily., Disp: , Rfl:    terconazole (TERAZOL 7) 0.4 % vaginal cream, Place 1 applicator vaginally at bedtime., Disp: 45 g, Rfl: 0   ibuprofen (ADVIL) 200 MG tablet, Take 400 mg by mouth daily as needed (headache/pain.). (Patient not taking: Reported on 10/09/2022), Disp: , Rfl:   Review of Systems  Review of Systems  Constitutional: Negative for fever, chills, weight loss,  malaise/fatigue and diaphoresis.  HENT: Negative for hearing loss, ear pain, nosebleeds, congestion, sore throat, neck pain, tinnitus and ear discharge.   Eyes: Negative for blurred vision, double vision, photophobia, pain, discharge and redness.  Respiratory: Negative for cough, hemoptysis, sputum production, shortness of breath, wheezing and stridor.   Cardiovascular: Negative for chest pain, palpitations, orthopnea, claudication, leg swelling and PND.  Gastrointestinal: negative for abdominal pain. Negative for heartburn, nausea, vomiting, diarrhea, constipation, blood in stool and melena.  Genitourinary: Negative for dysuria, urgency, frequency, hematuria and flank pain.  Musculoskeletal: Negative for myalgias, back pain, joint pain and falls.  Skin: Negative for itching and rash.  Neurological: Negative for dizziness, tingling, tremors, sensory change, speech change, focal weakness, seizures, loss of consciousness, weakness and headaches.  Endo/Heme/Allergies: Negative for environmental allergies and polydipsia. Does not bruise/bleed easily.  Psychiatric/Behavioral: Negative for depression, suicidal ideas, hallucinations, memory loss and substance abuse. The patient is not nervous/anxious and does not have insomnia.        Objective:  Blood pressure 139/85, pulse 75, height 5\' 1"  (1.549 m), weight 207 lb 8 oz (94.1 kg).   Physical Exam  Vitals reviewed. Constitutional: She is oriented to person, place, and time. She appears well-developed and well-nourished.  HENT:  Head: Normocephalic and atraumatic.        Right Ear: External ear normal.  Left Ear: External ear normal.  Nose: Nose normal.  Mouth/Throat: Oropharynx is clear and moist.  Eyes: Conjunctivae and EOM are normal. Pupils are equal, round, and reactive to light. Right eye exhibits no discharge. Left eye exhibits no discharge. No scleral icterus.  Neck: Normal range of motion. Neck supple. No tracheal deviation present. No  thyromegaly present.  Cardiovascular: Normal rate, regular rhythm, normal heart sounds and intact distal pulses.  Exam reveals no gallop and no friction rub.   No murmur heard. Respiratory: Effort normal and breath sounds normal. No respiratory distress. She has no wheezes. She has no rales. She exhibits no tenderness.  GI: Soft. Bowel sounds are normal. She exhibits no distension and no mass. There is no tenderness. There is no rebound and no guarding.  Genitourinary:  Breasts no masses skin changes or nipple changes bilaterally      Vulva is normal without lesions Vagina is pink moist without discharge Cervix normal in appearance and pap is not done Uterus is normal size shape and contour Adnexa is negative with normal sized ovaries   Musculoskeletal: Normal  range of motion. She exhibits no edema and no tenderness.  Neurological: She is alert and oriented to person, place, and time. She has normal reflexes. She displays normal reflexes. No cranial nerve deficit. She exhibits normal muscle tone. Coordination normal.  Skin: Skin is warm and dry. No rash noted. No erythema. No pallor.  Psychiatric: She has a normal mood and affect. Her behavior is normal. Judgment and thought content normal.       Medications Ordered at today's visit: Meds ordered this encounter  Medications   terconazole (TERAZOL 7) 0.4 % vaginal cream    Sig: Place 1 applicator vaginally at bedtime.    Dispense:  45 g    Refill:  0   medroxyPROGESTERone (PROVERA) 10 MG tablet    Sig: Take 1 tablet (10 mg total) by mouth daily.    Dispense:  10 tablet    Refill:  0    Other orders placed at today's visit: Orders Placed This Encounter  Procedures   HgB A1c      Assessment:       ICD-10-CM   1. Well woman exam with routine gynecological exam  Z01.419    Cytology/HPV next year    2. Yeast vaginitis  B37.31    Terazol 7 qhs x 7, check hemoglobin A1C    3. Perimenopausal  N95.1    Progesterone challenge     4. Hyperglycemia, unspecified  R73.9 HgB A1c        Plan:    Follow up in: 1 year.     Return in about 1 year (around 10/25/2023) for yearly.

## 2022-10-26 LAB — HEMOGLOBIN A1C
Est. average glucose Bld gHb Est-mCnc: 120 mg/dL
Hgb A1c MFr Bld: 5.8 % — ABNORMAL HIGH (ref 4.8–5.6)

## 2022-10-27 IMAGING — MG MM DIGITAL SCREENING BILAT W/ TOMO AND CAD
6 of 10 series · 6 of 30 positions shown · non-contrast
Comparison: Previous exam(s).

CLINICAL DATA: Screening.

EXAM:
DIGITAL SCREENING BILATERAL MAMMOGRAM WITH TOMOSYNTHESIS AND CAD
TECHNIQUE: Bilateral screening digital craniocaudal and mediolateral oblique
mammograms were obtained. Bilateral screening digital breast
tomosynthesis was performed. The images were evaluated with
computer-aided detection.

[L MLO synth-2D (1 of 2)]
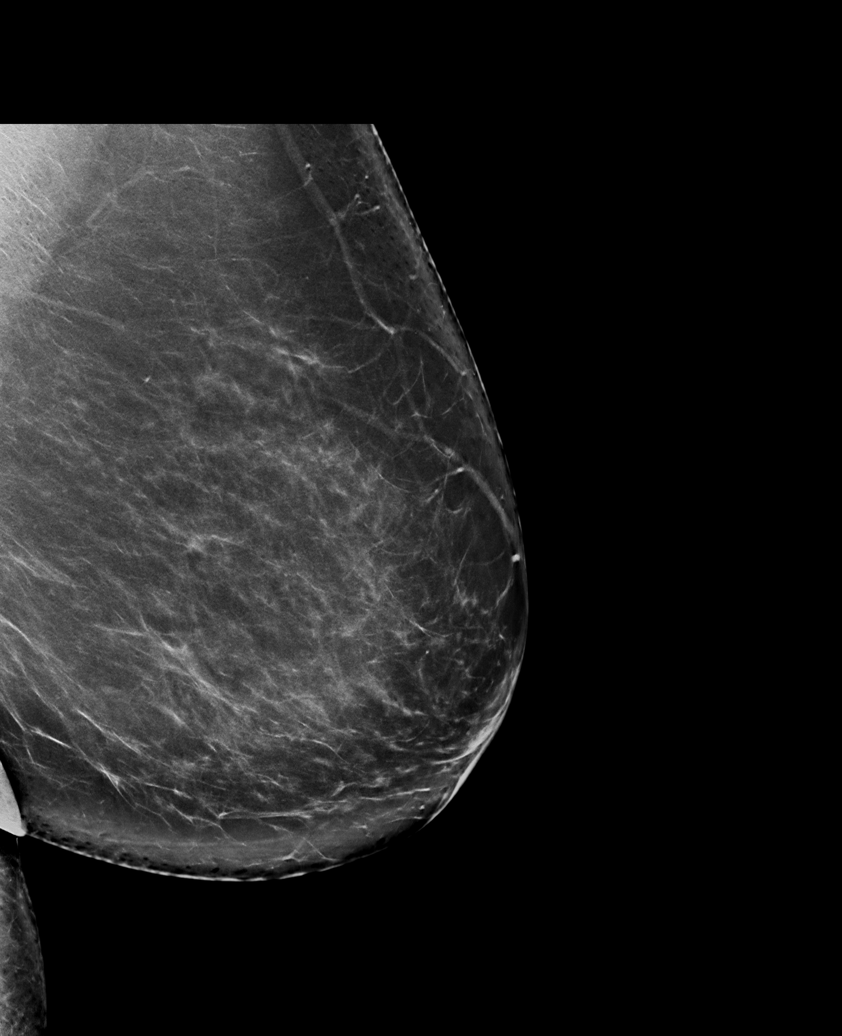

[R MLO synth-2D]
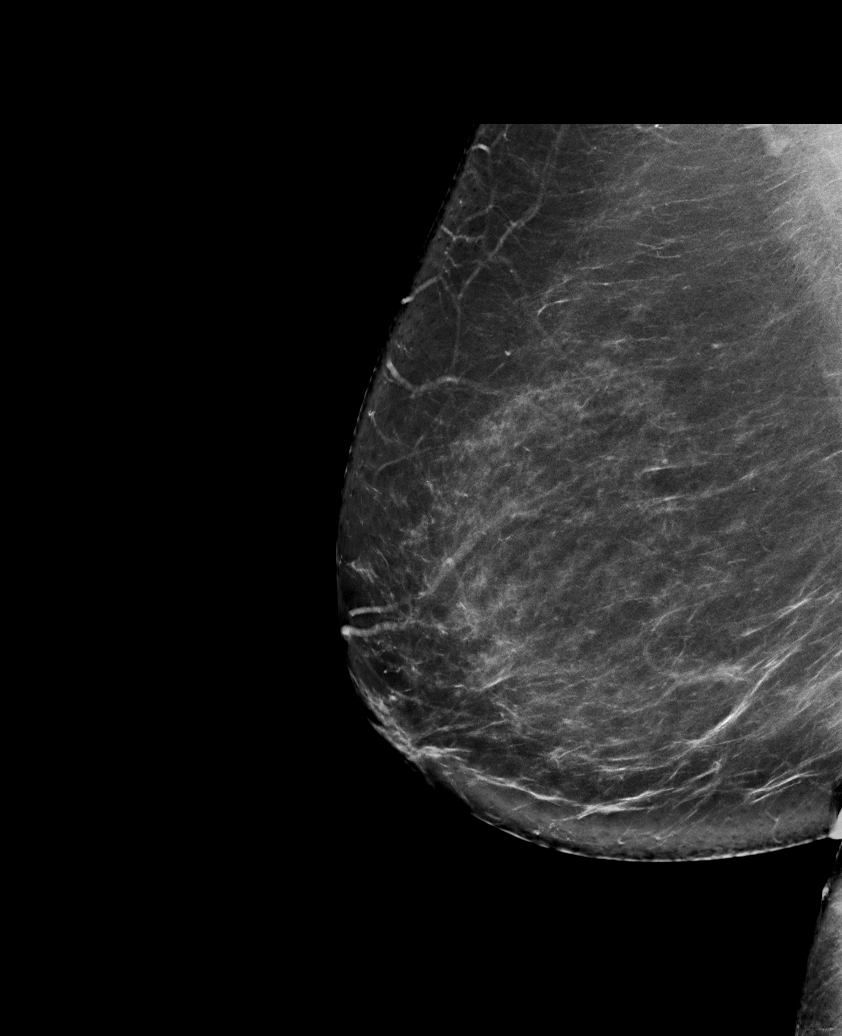

[L MLO synth-2D (2 of 2)]
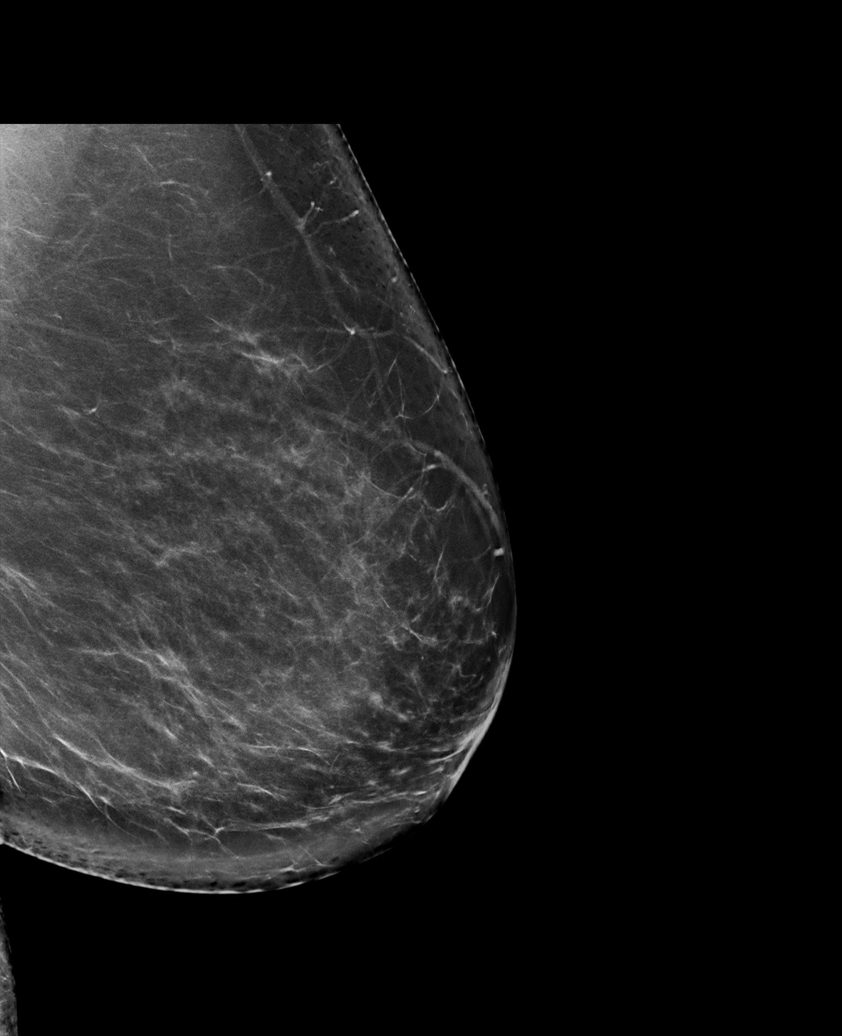

[L CC synth-2D]
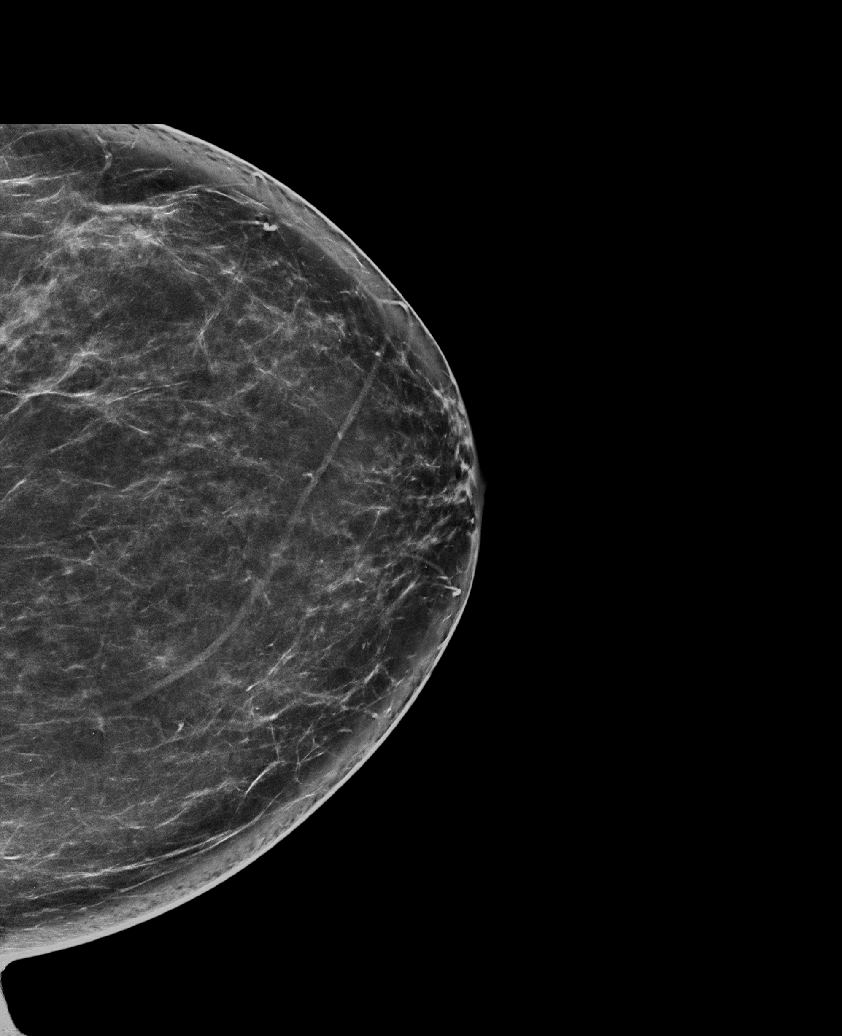

[R CC synth-2D]
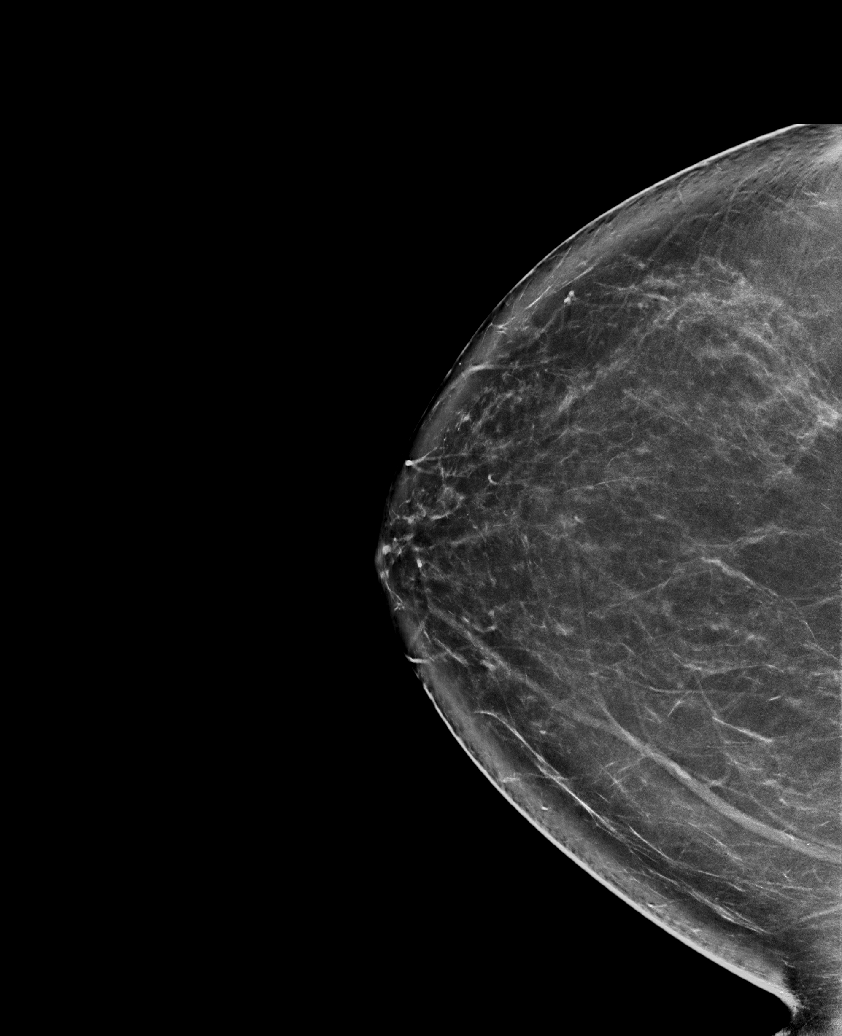

[R CC tomo · tomo slice 47/92.0]
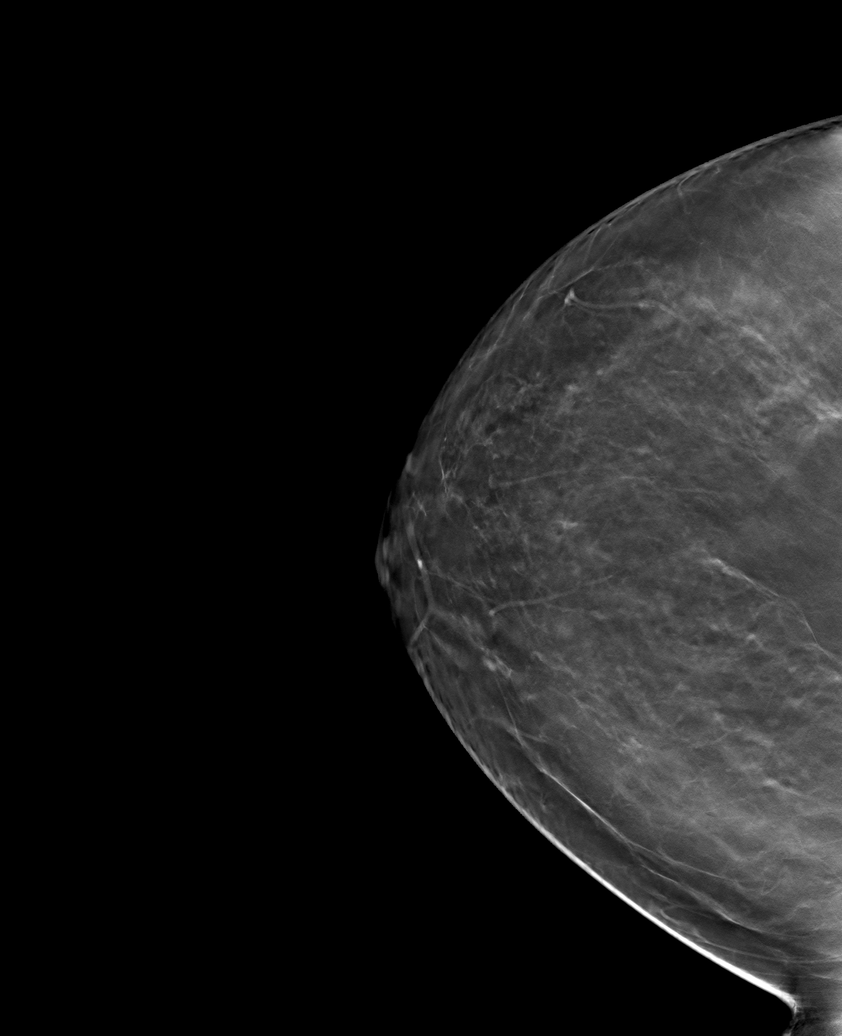

[6 of 30 positions shown; findings below may reference images not displayed]

ACR Breast Density Category b: There are scattered areas of
fibroglandular density.
FINDINGS: There are no findings suspicious for malignancy.
IMPRESSION: No mammographic evidence of malignancy. A result letter of this
screening mammogram will be mailed directly to the patient.

RECOMMENDATION:
Screening mammogram in one year. (Code:51-O-LD2)

BI-RADS CATEGORY  1: Negative.

## 2022-11-11 ENCOUNTER — Encounter: Payer: Self-pay | Admitting: Obstetrics & Gynecology

## 2022-11-11 MED ORDER — MEDROXYPROGESTERONE ACETATE 10 MG PO TABS: 10.0000 mg | ORAL_TABLET | Freq: Every day | ORAL | 11 refills | Status: DC

## 2022-11-12 MED ORDER — MEDROXYPROGESTERONE ACETATE 10 MG PO TABS
10.0000 mg | ORAL_TABLET | Freq: Every day | ORAL | 3 refills | Status: DC
Start: 2022-11-12 — End: 2023-10-09

## 2022-11-12 NOTE — Addendum Note (Signed)
Addended by: Lazaro Arms on: 11/12/2022 03:04 PM   Modules accepted: Orders

## 2023-03-14 ENCOUNTER — Encounter: Payer: Self-pay | Admitting: Obstetrics & Gynecology

## 2023-05-14 ENCOUNTER — Other Ambulatory Visit (HOSPITAL_COMMUNITY): Payer: Self-pay | Admitting: Physician Assistant

## 2023-05-14 DIAGNOSIS — Z1231 Encounter for screening mammogram for malignant neoplasm of breast: Secondary | ICD-10-CM

## 2023-06-30 ENCOUNTER — Encounter (HOSPITAL_COMMUNITY): Payer: Self-pay

## 2023-06-30 ENCOUNTER — Ambulatory Visit (HOSPITAL_COMMUNITY)
Admission: RE | Admit: 2023-06-30 | Discharge: 2023-06-30 | Disposition: A | Discharge status: Home / Self Care | Payer: BC Managed Care – PPO | Source: Ambulatory Visit | Attending: Physician Assistant | Admitting: Physician Assistant

## 2023-06-30 DIAGNOSIS — Z1231 Encounter for screening mammogram for malignant neoplasm of breast: Secondary | ICD-10-CM | POA: Insufficient documentation

## 2023-09-29 ENCOUNTER — Encounter: Payer: Self-pay | Admitting: Obstetrics & Gynecology

## 2023-10-09 ENCOUNTER — Ambulatory Visit: Payer: BC Managed Care – PPO | Admitting: Adult Health

## 2023-10-09 ENCOUNTER — Encounter: Payer: Self-pay | Admitting: Adult Health

## 2023-10-09 VITALS — BP 142/79 | HR 80 | Ht 61.0 in | Wt 206.0 lb

## 2023-10-09 DIAGNOSIS — G4733 Obstructive sleep apnea (adult) (pediatric): Secondary | ICD-10-CM

## 2023-10-09 NOTE — Progress Notes (Signed)
 [facility] [address] [place]. [place] [address] [phone]       OFFICE FOLLOW UP NOTE  Ms. Cheryl Steele Date of Birth:  [birth year] Medical Record Number:  [id number]    Primary neurologist: Dr. Frances Furbish  Reason for visit: OSA on CPAP    SUBJECTIVE:   CHIEF COMPLAINT:  Chief Complaint  Patient presents with   Obstructive Sleep Apnea    Rm 3 alone Pt is well and stable, reports no OSA/CPAP concerns.     Brief HPI:   Cheryl Steele is a 53 y.o. female who was seen by Dr. Vickey Huger in 09/2022 for transfer of care for OSA on CPAP from Dr. Gerilyn Pilgrim.  She was diagnosed with sleep apnea in 04/2020 and received CPAP in 01/2021 (d/t post pandemic supply chain delays).  Noted excellent compliance on CPAP therapy and recommended continuation.   Interval history:   Returns for CPAP compliance visit.  Continues to do well with CPAP.  Tolerates well and reports continued benefit.  DME [employer], up to date on supplies.   Compliance report over the past 30 days shows 30 out of 30 usage days with 30 days greater than 4 hours for 100% compliance.  Residual AHI 0.3 on pressure settings of 8-14 with EPR 1.  Pressure benefit percentile 11.7.  Leaks in the 95 percentile 5.8.       ROS:   14 system review of systems performed and negative with exception of those listed in HPI  PMH:  Past Medical History:  Diagnosis Date   Medical history non-contributory     PSH:  Past Surgical History:  Procedure Laterality Date   CESAREAN SECTION     COLONOSCOPY N/A 05/15/2021   Procedure: COLONOSCOPY;  Surgeon: Franky Macho, MD;  Location: [facility];  Service: Gastroenterology;  Laterality: N/A;    Social History:  Social History   Socioeconomic History   Marital status: Married    Spouse name: Not on file   Number of children: Not on file   Years of education: Not on file   Highest education level: Not on file  Occupational History   Not on file  Tobacco Use    Smoking status: Never   Smokeless tobacco: Never  Vaping Use   Vaping status: Never Used  Substance and Sexual Activity   Alcohol use: Not Currently   Drug use: Never   Sexual activity: Yes    Birth control/protection: None  Other Topics Concern   Not on file  Social History Narrative   Not on file   Social Drivers of Health   Financial Resource Strain: Low Risk  (04/06/2021)   Overall Financial Resource Strain (CARDIA)    Difficulty of Paying Living Expenses: Not hard at all  Food Insecurity: No Food Insecurity (04/06/2021)   Hunger Vital Sign    Worried About Running Out of Food in the Last Year: Never true    Ran Out of Food in the Last Year: Never true  Transportation Needs: No Transportation Needs (04/06/2021)   PRAPARE - Administrator, Civil Service (Medical): No    Lack of Transportation (Non-Medical): No  Physical Activity: Insufficiently Active (04/06/2021)   Exercise Vital Sign    Days of Exercise per Week: 2 days    Minutes of Exercise per Session: 10 min  Stress: No Stress Concern Present (04/06/2021)   [employer] of Occupational Health - Occupational Stress Questionnaire    Feeling of Stress : Not at all  Social Connections: Socially Integrated (04/06/2021)   Social Connection and Isolation Panel [NHANES]    Frequency of Communication with Friends and Family: More than three times a week    Frequency of Social Gatherings with Friends and Family: Twice a week    Attends Religious Services: More than 4 times per year    Active Member of [employer] or Organizations: Yes    Attends Engineer, structural: More than 4 times per year    Marital Status: Married  Catering manager Violence: Not At Risk (04/06/2021)   Humiliation, Afraid, Rape, and Kick questionnaire    Fear of Current or Ex-Partner: No    Emotionally Abused: No    Physically Abused: No    Sexually Abused: No    Family History:  Family History  Problem Relation Age of Onset    Breast cancer Sister    Breast cancer Maternal Aunt    Cancer - Colon Neg Hx     Medications:   Current Outpatient Medications on File Prior to Visit  Medication Sig Dispense Refill   Biotin 29562 MCG TABS Take 10,000 mcg by mouth in the morning.     medroxyPROGESTERone (PROVERA) 10 MG tablet Take 1 tablet (10 mg total) by mouth daily. 10 tablet 0   Multiple Vitamin (MULTIVITAMIN WITH MINERALS) TABS tablet Take 1 tablet by mouth in the morning. WOMEN'S 50+     Omega-3 Fatty Acids (FISH OIL ULTRA) 1400 MG CAPS Take 1,400 mg by mouth in the morning.     rosuvastatin (CRESTOR) 5 MG tablet Take 5 mg by mouth daily.     No current facility-administered medications on file prior to visit.    Allergies:  No Known Allergies    OBJECTIVE:  Physical Exam  Vitals:   10/09/23 1034  BP: (!) 142/79  Pulse: 80  Weight: 206 lb (93.4 kg)  Height: 5\' 1"  (1.549 m)   Body mass index is 38.92 kg/m. No results found.   General: well developed, well nourished, very pleasant middle-age Caucasian female, seated, in no evident distress Head: head normocephalic and atraumatic.   Neck: supple with no carotid or supraclavicular bruits Cardiovascular: regular rate and rhythm, no murmurs Musculoskeletal: no deformity Skin:  no rash/petichiae Vascular:  Normal pulses all extremities   Neurologic Exam Mental Status: Awake and fully alert. Oriented to place and time. Recent and remote memory intact. Attention span, concentration and fund of knowledge appropriate. Mood and affect appropriate.  Cranial Nerves: Extraocular movements full without nystagmus. Visual fields full to confrontation. Hearing intact. Facial sensation intact. Face, tongue, palate moves normally and symmetrically.  Motor: Normal bulk and tone. Normal strength in all tested extremity muscles Gait and Station: Arises from chair without difficulty. Stance is normal. Gait demonstrates normal stride length and balance          ASSESSMENT: Cheryl Steele is a 53 y.o. year old female     PLAN:  OSA on CPAP:  Compliance report shows satisfactory usage with optimal residual AHI.  Continue current pressure setting of 8-14 with EPR 1.  Discussed continued nightly usage with ensuring greater than 4 hours nightly for optimal benefit and per insurance purposes.  Continue to follow with DME company for any needed supplies or CPAP related concerns     Follow up in 1 year or call earlier if needed      I spent 15 minutes of face-to-face and non-face-to-face time with patient.  This included previsit chart review, lab review, study review, order  entry, electronic health record documentation, patient education and discussion regarding above diagnoses and treatment plan and answered all other questions to patient's satisfaction  Ihor Austin, [facility]  [facility] [address] [place], [place] [address]  Phone [phone] Fax [phone] Note: This document was prepared with digital dictation and possible smart phrase technology. Any transcriptional errors that result from this process are unintentional.

## 2023-10-31 ENCOUNTER — Other Ambulatory Visit (HOSPITAL_COMMUNITY)
Admission: RE | Admit: 2023-10-31 | Discharge: 2023-10-31 | Disposition: A | Discharge status: Home / Self Care | Source: Ambulatory Visit | Attending: Obstetrics & Gynecology | Admitting: Obstetrics & Gynecology

## 2023-10-31 ENCOUNTER — Ambulatory Visit: Admitting: Obstetrics & Gynecology

## 2023-10-31 ENCOUNTER — Encounter: Payer: Self-pay | Admitting: Obstetrics & Gynecology

## 2023-10-31 VITALS — BP 146/90 | HR 74 | Ht 61.0 in | Wt 208.0 lb

## 2023-10-31 DIAGNOSIS — N951 Menopausal and female climacteric states: Secondary | ICD-10-CM | POA: Diagnosis not present

## 2023-10-31 DIAGNOSIS — Z01419 Encounter for gynecological examination (general) (routine) without abnormal findings: Secondary | ICD-10-CM

## 2023-10-31 NOTE — Addendum Note (Signed)
 Addended by: Federico Flake A on: 10/31/2023 11:17 AM   Modules accepted: Orders

## 2023-10-31 NOTE — Progress Notes (Signed)
 Subjective:     Cheryl Steele is a 53 y.o. female here for a routine exam.  No LMP recorded. Patient is perimenopausal. G1P0 Birth Control Method:  menopausal likely Menstrual Calendar(currently): no period since 12/2022  Current complaints: none.   Current acute medical issues:  none   Recent Gynecologic History No LMP recorded. Patient is perimenopausal. Last Pap: 03/2021,  normal Last mammogram: 12/24,  normal  Past Medical History:  Diagnosis Date   Medical history non-contributory     Past Surgical History:  Procedure Laterality Date   CESAREAN SECTION     COLONOSCOPY N/A 05/15/2021   Procedure: COLONOSCOPY;  Surgeon: Franky Macho, MD;  Location: [facility];  Service: Gastroenterology;  Laterality: N/A;    OB History     Gravida  1   Para      Term      Preterm      AB      Living         SAB      IAB      Ectopic      Multiple      Live Births              Social History   Socioeconomic History   Marital status: Married    Spouse name: Not on file   Number of children: Not on file   Years of education: Not on file   Highest education level: Not on file  Occupational History   Not on file  Tobacco Use   Smoking status: Never   Smokeless tobacco: Never  Vaping Use   Vaping status: Never Used  Substance and Sexual Activity   Alcohol use: Not Currently   Drug use: Never   Sexual activity: Yes    Birth control/protection: None  Other Topics Concern   Not on file  Social History Narrative   Not on file   Social Drivers of Health   Financial Resource Strain: Low Risk  (10/31/2023)   Overall Financial Resource Strain (CARDIA)    Difficulty of Paying Living Expenses: Not very hard  Food Insecurity: No Food Insecurity (10/31/2023)   Hunger Vital Sign    Worried About Running Out of Food in the Last Year: Never true    Ran Out of Food in the Last Year: Never true  Transportation Needs: No Transportation Needs (10/31/2023)   PRAPARE -  Administrator, Civil Service (Medical): No    Lack of Transportation (Non-Medical): No  Physical Activity: Insufficiently Active (10/31/2023)   Exercise Vital Sign    Days of Exercise per Week: 1 day    Minutes of Exercise per Session: 10 min  Stress: No Stress Concern Present (10/31/2023)   [employer] of Occupational Health - Occupational Stress Questionnaire    Feeling of Stress : Not at all  Social Connections: Socially Integrated (10/31/2023)   Social Connection and Isolation Panel [NHANES]    Frequency of Communication with Friends and Family: More than three times a week    Frequency of Social Gatherings with Friends and Family: Twice a week    Attends Religious Services: More than 4 times per year    Active Member of [employer] or Organizations: Yes    Attends Engineer, structural: More than 4 times per year    Marital Status: Married    Family History  Problem Relation Age of Onset   Breast cancer Sister    Breast cancer Maternal Aunt  Cancer - Colon Neg Hx      Current Outpatient Medications:    Biotin 16109 MCG TABS, Take 10,000 mcg by mouth in the morning., Disp: , Rfl:    medroxyPROGESTERone (PROVERA) 10 MG tablet, Take 1 tablet (10 mg total) by mouth daily., Disp: 10 tablet, Rfl: 0   Multiple Vitamin (MULTIVITAMIN WITH MINERALS) TABS tablet, Take 1 tablet by mouth in the morning. WOMEN'S 50+, Disp: , Rfl:    Omega-3 Fatty Acids (FISH OIL ULTRA) 1400 MG CAPS, Take 1,400 mg by mouth in the morning., Disp: , Rfl:    rosuvastatin (CRESTOR) 5 MG tablet, Take 5 mg by mouth daily., Disp: , Rfl:   Review of Systems  Review of Systems  Constitutional: Negative for fever, chills, weight loss, malaise/fatigue and diaphoresis.  HENT: Negative for hearing loss, ear pain, nosebleeds, congestion, sore throat, neck pain, tinnitus and ear discharge.   Eyes: Negative for blurred vision, double vision, photophobia, pain, discharge and redness.  Respiratory:  Negative for cough, hemoptysis, sputum production, shortness of breath, wheezing and stridor.   Cardiovascular: Negative for chest pain, palpitations, orthopnea, claudication, leg swelling and PND.  Gastrointestinal: negative for abdominal pain. Negative for heartburn, nausea, vomiting, diarrhea, constipation, blood in stool and melena.  Genitourinary: Negative for dysuria, urgency, frequency, hematuria and flank pain.  Musculoskeletal: Negative for myalgias, back pain, joint pain and falls.  Skin: Negative for itching and rash.  Neurological: Negative for dizziness, tingling, tremors, sensory change, speech change, focal weakness, seizures, loss of consciousness, weakness and headaches.  Endo/Heme/Allergies: Negative for environmental allergies and polydipsia. Does not bruise/bleed easily.  Psychiatric/Behavioral: Negative for depression, suicidal ideas, hallucinations, memory loss and substance abuse. The patient is not nervous/anxious and does not have insomnia.        Objective:  Blood pressure (!) 146/90, pulse 74, height 5\' 1"  (1.549 m), weight 208 lb (94.3 kg).   Physical Exam  Vitals reviewed. Constitutional: She is oriented to person, place, and time. She appears well-developed and well-nourished.  HENT:  Head: Normocephalic and atraumatic.        Right Ear: External ear normal.  Left Ear: External ear normal.  Nose: Nose normal.  Mouth/Throat: Oropharynx is clear and moist.  Eyes: Conjunctivae and EOM are normal. Pupils are equal, round, and reactive to light. Right eye exhibits no discharge. Left eye exhibits no discharge. No scleral icterus.  Neck: Normal range of motion. Neck supple. No tracheal deviation present. No thyromegaly present.  Cardiovascular: Normal rate, regular rhythm, normal heart sounds and intact distal pulses.  Exam reveals no gallop and no friction rub.   No murmur heard. Respiratory: Effort normal and breath sounds normal. No respiratory distress. She has  no wheezes. She has no rales. She exhibits no tenderness.  GI: Soft. Bowel sounds are normal. She exhibits no distension and no mass. There is no tenderness. There is no rebound and no guarding.  Genitourinary:  Breasts no masses skin changes or nipple changes bilaterally      Vulva is normal without lesions Vagina is pink moist without discharge Cervix normal in appearance and pap is done Uterus is normal size shape and contour Adnexa is negative with normal sized ovaries   Musculoskeletal: Normal range of motion. She exhibits no edema and no tenderness.  Neurological: She is alert and oriented to person, place, and time. She has normal reflexes. She displays normal reflexes. No cranial nerve deficit. She exhibits normal muscle tone. Coordination normal.  Skin: Skin is warm and dry.  No rash noted. No erythema. No pallor.  Psychiatric: She has a normal mood and affect. Her behavior is normal. Judgment and thought content normal.       Medications Ordered at today's visit: No orders of the defined types were placed in this encounter.   Other orders placed at today's visit: No orders of the defined types were placed in this encounter.    ASSESSMENT + PLAN:    ICD-10-CM   1. Well woman exam with routine gynecological exam  Z01.419     2. Perimenopausal: continue cyclical provera 10 mg x 10d  N95.1           Return in about 3 years (around 10/31/2026).

## 2023-11-06 ENCOUNTER — Encounter: Payer: Self-pay | Admitting: Obstetrics & Gynecology

## 2023-11-06 LAB — CYTOLOGY - PAP
Comment: NEGATIVE
Diagnosis: NEGATIVE
High risk HPV: NEGATIVE

## 2024-01-06 ENCOUNTER — Encounter: Payer: Self-pay | Admitting: Obstetrics & Gynecology

## 2024-01-11 ENCOUNTER — Other Ambulatory Visit: Payer: Self-pay | Admitting: Obstetrics & Gynecology

## 2024-05-28 ENCOUNTER — Other Ambulatory Visit (HOSPITAL_COMMUNITY): Payer: Self-pay | Admitting: Physician Assistant

## 2024-05-28 DIAGNOSIS — Z1231 Encounter for screening mammogram for malignant neoplasm of breast: Secondary | ICD-10-CM

## 2024-07-02 ENCOUNTER — Encounter (HOSPITAL_COMMUNITY): Payer: Self-pay

## 2024-07-02 ENCOUNTER — Ambulatory Visit (HOSPITAL_COMMUNITY)
Admission: RE | Admit: 2024-07-02 | Discharge: 2024-07-02 | Disposition: A | Source: Ambulatory Visit | Attending: Physician Assistant | Admitting: Physician Assistant

## 2024-07-02 DIAGNOSIS — Z1231 Encounter for screening mammogram for malignant neoplasm of breast: Secondary | ICD-10-CM

## 2024-07-26 ENCOUNTER — Encounter: Payer: Self-pay | Admitting: Obstetrics & Gynecology

## 2024-07-30 MED ORDER — MEDROXYPROGESTERONE ACETATE 10 MG PO TABS: 10.0000 mg | ORAL_TABLET | Freq: Every day | ORAL | 3 refills | Status: AC

## 2024-10-12 ENCOUNTER — Ambulatory Visit: Admitting: Adult Health
